# Patient Record
Sex: Male | Born: 1975 | Race: White | Hispanic: No | Smoking: Current some day smoker
Health system: Southern US, Community
[De-identification: ages and names within clinical notes are randomized; demographics above are authoritative.]

---

## 2018-06-04 ENCOUNTER — Emergency Department (HOSPITAL_COMMUNITY)
Admission: EM | Admit: 2018-06-04 | Discharge: 2018-06-04 | Disposition: A | Discharge status: Home / Self Care | Payer: BLUE CROSS/BLUE SHIELD | Attending: Emergency Medicine | Admitting: Emergency Medicine

## 2018-06-04 ENCOUNTER — Encounter (HOSPITAL_COMMUNITY): Payer: Self-pay

## 2018-06-04 ENCOUNTER — Emergency Department (HOSPITAL_COMMUNITY): Payer: BLUE CROSS/BLUE SHIELD

## 2018-06-04 ENCOUNTER — Other Ambulatory Visit: Payer: Self-pay

## 2018-06-04 DIAGNOSIS — Z03818 Encounter for observation for suspected exposure to other biological agents ruled out: Secondary | ICD-10-CM | POA: Diagnosis not present

## 2018-06-04 DIAGNOSIS — R002 Palpitations: Secondary | ICD-10-CM | POA: Insufficient documentation

## 2018-06-04 DIAGNOSIS — R42 Dizziness and giddiness: Secondary | ICD-10-CM | POA: Insufficient documentation

## 2018-06-04 DIAGNOSIS — H52532 Spasm of accommodation, left eye: Secondary | ICD-10-CM | POA: Diagnosis not present

## 2018-06-04 DIAGNOSIS — R079 Chest pain, unspecified: Secondary | ICD-10-CM | POA: Insufficient documentation

## 2018-06-04 DIAGNOSIS — R202 Paresthesia of skin: Secondary | ICD-10-CM | POA: Diagnosis not present

## 2018-06-04 DIAGNOSIS — R1010 Upper abdominal pain, unspecified: Secondary | ICD-10-CM | POA: Insufficient documentation

## 2018-06-04 DIAGNOSIS — F17210 Nicotine dependence, cigarettes, uncomplicated: Secondary | ICD-10-CM | POA: Insufficient documentation

## 2018-06-04 DIAGNOSIS — R0602 Shortness of breath: Secondary | ICD-10-CM | POA: Insufficient documentation

## 2018-06-04 DIAGNOSIS — Z20828 Contact with and (suspected) exposure to other viral communicable diseases: Secondary | ICD-10-CM | POA: Diagnosis not present

## 2018-06-04 LAB — CBC WITH DIFFERENTIAL/PLATELET
Abs Immature Granulocytes: 0.04 10*3/uL (ref 0.00–0.07)
Basophils Absolute: 0 10*3/uL (ref 0.0–0.1)
Basophils Relative: 1 %
Eosinophils Absolute: 0.1 10*3/uL (ref 0.0–0.5)
Eosinophils Relative: 2 %
HCT: 44.9 % (ref 39.0–52.0)
Hemoglobin: 14.6 g/dL (ref 13.0–17.0)
Immature Granulocytes: 1 %
Lymphocytes Relative: 23 %
Lymphs Abs: 1.9 10*3/uL (ref 0.7–4.0)
MCH: 29.4 pg (ref 26.0–34.0)
MCHC: 32.5 g/dL (ref 30.0–36.0)
MCV: 90.3 fL (ref 80.0–100.0)
Monocytes Absolute: 0.9 10*3/uL (ref 0.1–1.0)
Monocytes Relative: 11 %
Neutro Abs: 5.4 10*3/uL (ref 1.7–7.7)
Neutrophils Relative %: 62 %
Platelets: 248 10*3/uL (ref 150–400)
RBC: 4.97 MIL/uL (ref 4.22–5.81)
RDW: 12.8 % (ref 11.5–15.5)
WBC: 8.4 10*3/uL (ref 4.0–10.5)
nRBC: 0 % (ref 0.0–0.2)

## 2018-06-04 LAB — COMPREHENSIVE METABOLIC PANEL
ALT: 36 U/L (ref 0–44)
AST: 31 U/L (ref 15–41)
Albumin: 4.2 g/dL (ref 3.5–5.0)
Alkaline Phosphatase: 71 U/L (ref 38–126)
Anion gap: 8 (ref 5–15)
BUN: 15 mg/dL (ref 6–20)
CO2: 26 mmol/L (ref 22–32)
Calcium: 9.3 mg/dL (ref 8.9–10.3)
Chloride: 104 mmol/L (ref 98–111)
Creatinine, Ser: 0.85 mg/dL (ref 0.61–1.24)
GFR calc Af Amer: >60 mL/min (ref >60)
GFR calc non Af Amer: >60 mL/min (ref >60)
Glucose, Bld: 132 mg/dL — ABNORMAL HIGH (ref 70–99)
Potassium: 3.9 mmol/L (ref 3.5–5.1)
Sodium: 138 mmol/L (ref 135–145)
Total Bilirubin: 0.7 mg/dL (ref 0.3–1.2)
Total Protein: 7.7 g/dL (ref 6.5–8.1)

## 2018-06-04 LAB — URINALYSIS, ROUTINE W REFLEX MICROSCOPIC
Bilirubin Urine: NEGATIVE
Glucose, UA: NEGATIVE mg/dL
Hgb urine dipstick: NEGATIVE
Ketones, ur: NEGATIVE mg/dL
Leukocytes,Ua: NEGATIVE
Nitrite: NEGATIVE
Protein, ur: NEGATIVE mg/dL
Specific Gravity, Urine: 1.014 (ref 1.005–1.030)
pH: 7 (ref 5.0–8.0)

## 2018-06-04 LAB — LIPASE, BLOOD: Lipase: 25 U/L (ref 11–51)

## 2018-06-04 LAB — SARS CORONAVIRUS 2 BY RT PCR (HOSPITAL ORDER, PERFORMED IN ~~LOC~~ HOSPITAL LAB): SARS Coronavirus 2: NEGATIVE

## 2018-06-04 LAB — TROPONIN I
Troponin I: <0.03 ng/mL (ref <0.03)
Troponin I: <0.03 ng/mL (ref <0.03)

## 2018-06-04 LAB — D-DIMER, QUANTITATIVE: D-Dimer, Quant: <0.27 ug/mL-FEU (ref 0.00–0.50)

## 2018-06-04 MED ORDER — SODIUM CHLORIDE 0.9 % IV BOLUS
1000.0000 mL | Freq: Once | INTRAVENOUS | Status: AC
  Administered 2018-06-04: 13:00:00 1000 mL via INTRAVENOUS

## 2018-06-04 MED ORDER — ALUM & MAG HYDROXIDE-SIMETH 200-200-20 MG/5ML PO SUSP
30.0000 mL | Freq: Once | ORAL | Status: AC
  Administered 2018-06-04: 30 mL via ORAL
  Filled 2018-06-04: qty 30

## 2018-06-04 MED ORDER — PANTOPRAZOLE SODIUM 40 MG IV SOLR
40.0000 mg | Freq: Once | INTRAVENOUS | Status: AC
  Administered 2018-06-04: 40 mg via INTRAVENOUS
  Filled 2018-06-04: qty 40

## 2018-06-04 MED ORDER — FAMOTIDINE IN NACL 20-0.9 MG/50ML-% IV SOLN
20.0000 mg | Freq: Once | INTRAVENOUS | Status: AC
  Administered 2018-06-04: 14:00:00 20 mg via INTRAVENOUS
  Filled 2018-06-04: qty 50

## 2018-06-04 MED ORDER — LIDOCAINE VISCOUS HCL 2 % MT SOLN
15.0000 mL | Freq: Once | OROMUCOSAL | Status: AC
  Administered 2018-06-04: 13:00:00 15 mL via ORAL
  Filled 2018-06-04: qty 15

## 2018-06-04 NOTE — ED Triage Notes (Signed)
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

## 2018-06-04 NOTE — ED Notes (Signed)
Patient transported to X-ray 

## 2018-06-04 NOTE — ED Notes (Signed)
Pt ambulated to restroom. 

## 2018-06-04 NOTE — ED Provider Notes (Signed)
[facility] Note   CSN: [id number] Arrival date & time:       History   Chief Complaint Chief Complaint  Patient presents with   Chest Pain    HPI Alan Evans is a 43 y.o. male.     HPI  Alan Evans is a 43 y.o. male, patient with no pertinent past medical history, presenting to the ED with palpitations. 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  While driving, he states the muscles around his left eye began to twitch, this made him anxious as he did not know what was happening.  He began to feel "tightness in the diaphragm area" combined with heart racing, shortness of breath, lightheadedness, and tingling in hands and feet.  He pulled over, went inside to urinate, then called 911.  He took 325 mg aspirin prior to arrival. Symptoms lasted for 1 to 2 minutes.  He still endorses discomfort in the upper abdomen/lower chest that "feels like gas."  This sensation improves with burping. He states he has had instances of anxiety in the past, but none to this degree and with sense of panic. As of this weekend, he was able to coach and play baseball, including running drills without onset of any symptoms. He adds he had a stressful and upsetting conversation with his son earlier this morning.  He also added an extra shot of espresso to his coffee midmorning. Denies fever/chills, syncope, diaphoresis, N/V/D, urinary symptoms, recent illness, cough, current shortness of breath, vision changes, neurologic deficits, lower extremity edema/pain, or any other complaints.    History reviewed. No pertinent past medical history.  There are no active problems to display for this patient.   History reviewed. No pertinent surgical history.      Home Medications    Prior to Admission medications   Not on File    Family History No family history on file.  Social History Social History   Tobacco Use   Smoking  status: Current Some Day Smoker    Types: Cigarettes   Smokeless tobacco: Current User    Types: Snuff  Substance Use Topics   Alcohol use: Yes    Alcohol/week: 4.0 standard drinks    Types: 4 Cans of beer per week   Drug use: Not Currently     Allergies   Patient has no known allergies.   Review of Systems Review of Systems  Constitutional: Negative for chills, diaphoresis and fever.  Eyes: Negative for visual disturbance.  Respiratory: Positive for shortness of breath (resolved). Negative for cough.   Cardiovascular: Positive for palpitations.  Gastrointestinal: Positive for abdominal pain (Upper abdominal pressure). Negative for diarrhea, nausea and vomiting.  Musculoskeletal: Negative for back pain.  Neurological: Positive for light-headedness. Negative for syncope, weakness, numbness and headaches.  Psychiatric/Behavioral: Negative for confusion.  All other systems reviewed and are negative.    Physical Exam Updated Vital Signs BP (!) 154/80 (BP Location: Right Arm)    Pulse 81    Temp 98.8 F (37.1 C) (Oral)    Resp 20    Ht 5' 10.5" (1.791 m)    Wt 131.5 kg    SpO2 100%    BMI 41.02 kg/m   Physical Exam Vitals signs and nursing note reviewed.  Constitutional:      General: He is not in acute distress.    Appearance: He is well-developed. He is obese. He is not diaphoretic.  HENT:     Head:  Normocephalic and atraumatic.     Mouth/Throat:     Mouth: Mucous membranes are moist.     Pharynx: Oropharynx is clear.  Eyes:     Conjunctiva/sclera: Conjunctivae normal.  Neck:     Musculoskeletal: Neck supple.  Cardiovascular:     Rate and Rhythm: Normal rate and regular rhythm.     Pulses: Normal pulses.          Radial pulses are 2+ on the right side and 2+ on the left side.       Posterior tibial pulses are 2+ on the right side and 2+ on the left side.     Heart sounds: Normal heart sounds.     Comments: Tactile temperature in the extremities appropriate and  equal bilaterally. Pulmonary:     Effort: Pulmonary effort is normal. No respiratory distress.     Breath sounds: Normal breath sounds.  Abdominal:     Palpations: Abdomen is soft.     Tenderness: There is no abdominal tenderness. There is no guarding.  Musculoskeletal:     Right lower leg: No edema.     Left lower leg: No edema.  Lymphadenopathy:     Cervical: No cervical adenopathy.  Skin:    General: Skin is warm and dry.  Neurological:     Mental Status: He is alert.     Comments: Sensation grossly intact to light touch in the extremities.  Grip strengths equal bilaterally.  Strength 5/5 in all extremities. No gait disturbance. Coordination intact. Cranial nerves III-XII grossly intact. No facial droop.   Psychiatric:        Mood and Affect: Mood and affect normal.        Speech: Speech normal.        Behavior: Behavior normal.      ED Treatments / Results  Labs (all labs ordered are listed, but only abnormal results are displayed) Labs Reviewed  COMPREHENSIVE METABOLIC PANEL - Abnormal; Notable for the following components:      Result Value   Glucose, Bld 132 (*)    All other components within normal limits  SARS CORONAVIRUS 2 (HOSPITAL ORDER, PERFORMED IN [facility])  CBC WITH DIFFERENTIAL/PLATELET  URINALYSIS, ROUTINE W REFLEX MICROSCOPIC  LIPASE, BLOOD  TROPONIN I  D-DIMER, QUANTITATIVE (NOT AT [facility])  TROPONIN I    EKG EKG Interpretation  Date/Time:  Tuesday Jun 04 2018 12:02:30 EDT Ventricular Rate:  86 PR Interval:    QRS Duration: 98 QT Interval:  356 QTC Calculation: 426 R Axis:   23 Text Interpretation:  Sinus rhythm No old tracing to compare Confirmed by Rolan Bucco [phone]) on 06/04/2018 12:06:13 PM   Radiology Dg Chest 2 View  Result Date: 06/04/2018 CLINICAL DATA:  Shortness of Breath and chest pain EXAM: CHEST - 2 VIEW COMPARISON:  None. FINDINGS: Cardiac shadow is at the upper limits of normal in size. The lungs are clear.  No infiltrate is noted. No acute bony abnormality is noted. IMPRESSION: No acute abnormality noted. Electronically Signed   By: Alcide Clever M.D.   On: 06/04/2018 13:07    Procedures Procedures (including critical care time)  Medications Ordered in ED Medications  pantoprazole (PROTONIX) injection 40 mg (40 mg Intravenous Given 06/04/18 1328)  famotidine (PEPCID) IVPB 20 mg premix (0 mg Intravenous Stopped 06/04/18 1412)  sodium chloride 0.9 % bolus 1,000 mL (0 mLs Intravenous Stopped 06/04/18 1501)  alum & mag hydroxide-simeth (MAALOX/MYLANTA) 200-200-20 MG/5ML suspension 30 mL (30 mLs Oral Given  06/04/18 1325)    And  lidocaine (XYLOCAINE) 2 % viscous mouth solution 15 mL (15 mLs Oral Given 06/04/18 1325)     Initial Impression / Assessment and Plan / ED Course  I have reviewed the triage vital signs and the nursing notes.  Pertinent labs & imaging results that were available during my care of the patient were reviewed by me and considered in my medical decision making (see chart for details).  Clinical Course as of Jun 04 1755  Tue Jun 04, 2018  1429 Patient voices improvement in symptoms.    [SJ]    Clinical Course User Index [SJ] Marlisa Caridi C, PA-C       Patient presents with an episode of chest discomfort, shortness of breath, tachypnea, and palpitations.  Patient is nontoxic appearing, afebrile, not tachycardic, not tachypneic, not hypotensive, maintains excellent SPO2 on room air, and is in no apparent distress.   Low suspicion for ACS. HEART score is 1, indicating low risk for a cardiac event.  EKG without evidence of acute ischemia or pathologic/symptomatic arrhythmia.  Delta troponins negative. Wells criteria score is 0, indicating low risk for PE.  D-dimer negative. Dissection was considered, but thought less likely base on: History and description of the pain are not suggestive, patient is not ill-appearing, lack of risk factors, equal bilateral pulses, lack of neurologic  deficits, no widened mediastinum on chest x-ray. No acute abnormalities on chest x-ray. Patient tested for coronavirus due to history of frequent travel as well as symptoms of chest discomfort.  Coronavirus test returned negative.  Based on patient's description of the event, reassuring lab work and EKG, and details above, my suspicion for cardiac event is low and features suggest possible panic attack.  However, patient will follow-up with cardiology for further testing as necessary. The patient was given instructions for home care as well as return precautions. Patient voices understanding of these instructions, accepts the plan, and is comfortable with discharge.  Dionis Spar was evaluated in Emergency Department on 06/04/2018 for the symptoms described in the history of present illness. He was evaluated in the context of the global COVID-19 pandemic, which necessitated consideration that the patient might be at risk for infection with the SARS-CoV-2 virus that causes COVID-19. Institutional protocols and algorithms that pertain to the evaluation of patients at risk for COVID-19 are in a state of rapid change based on information released by regulatory bodies including the CDC and federal and state organizations. These policies and algorithms were followed during the patient's care in the ED.   Vitals:   06/04/18 1530 06/04/18 1630 06/04/18 1700 06/04/18 1751  BP: 136/82 (!) 147/75 (!) 151/83 121/72  Pulse: 65 (!) 58 (!) 57 60  Resp: 18 15 18 18   Temp:      TempSrc:      SpO2: 98% 95% 94% 97%  Weight:      Height:        Final Clinical Impressions(s) / ED Diagnoses   Final diagnoses:  Lightheadedness    ED Discharge Orders    None       Concepcion Living 06/04/18 1759    Rolan Bucco, MD 06/04/18 1806

## 2018-06-04 NOTE — ED Notes (Signed)
Pt's boss Reuel Boom HY#[phone]. Pls inform Pt

## 2018-06-04 NOTE — Discharge Instructions (Addendum)
Lab work and chest x-ray reassuring today. Be sure to stay well-hydrated by drinking plenty of water. Follow-up with cardiology on this matter.  Call the number provided to set up an appointment. Return to the ED for chest pain, shortness of breath, dizziness, passing out, or any other major concerns.

## 2018-06-07 ENCOUNTER — Emergency Department (HOSPITAL_COMMUNITY): Payer: BLUE CROSS/BLUE SHIELD

## 2018-06-07 ENCOUNTER — Encounter (HOSPITAL_COMMUNITY): Payer: Self-pay

## 2018-06-07 ENCOUNTER — Other Ambulatory Visit: Payer: Self-pay

## 2018-06-07 ENCOUNTER — Emergency Department (HOSPITAL_COMMUNITY)
Admission: EM | Admit: 2018-06-07 | Discharge: 2018-06-07 | Disposition: A | Discharge status: Home / Self Care | Payer: BLUE CROSS/BLUE SHIELD | Attending: Emergency Medicine | Admitting: Emergency Medicine

## 2018-06-07 DIAGNOSIS — Z7982 Long term (current) use of aspirin: Secondary | ICD-10-CM | POA: Insufficient documentation

## 2018-06-07 DIAGNOSIS — R0789 Other chest pain: Secondary | ICD-10-CM | POA: Diagnosis not present

## 2018-06-07 DIAGNOSIS — R079 Chest pain, unspecified: Secondary | ICD-10-CM

## 2018-06-07 DIAGNOSIS — R42 Dizziness and giddiness: Secondary | ICD-10-CM | POA: Insufficient documentation

## 2018-06-07 DIAGNOSIS — F419 Anxiety disorder, unspecified: Secondary | ICD-10-CM | POA: Diagnosis not present

## 2018-06-07 LAB — CBC WITH DIFFERENTIAL/PLATELET
Abs Immature Granulocytes: 0.05 10*3/uL (ref 0.00–0.07)
Basophils Absolute: 0 10*3/uL (ref 0.0–0.1)
Basophils Relative: 0 %
Eosinophils Absolute: 0.1 10*3/uL (ref 0.0–0.5)
Eosinophils Relative: 1 %
HCT: 46.7 % (ref 39.0–52.0)
Hemoglobin: 15 g/dL (ref 13.0–17.0)
Immature Granulocytes: 1 %
Lymphocytes Relative: 16 %
Lymphs Abs: 1.5 10*3/uL (ref 0.7–4.0)
MCH: 29 pg (ref 26.0–34.0)
MCHC: 32.1 g/dL (ref 30.0–36.0)
MCV: 90.3 fL (ref 80.0–100.0)
Monocytes Absolute: 0.8 10*3/uL (ref 0.1–1.0)
Monocytes Relative: 8 %
Neutro Abs: 6.8 10*3/uL (ref 1.7–7.7)
Neutrophils Relative %: 74 %
Platelets: 232 10*3/uL (ref 150–400)
RBC: 5.17 MIL/uL (ref 4.22–5.81)
RDW: 12.7 % (ref 11.5–15.5)
WBC: 9.2 10*3/uL (ref 4.0–10.5)
nRBC: 0 % (ref 0.0–0.2)

## 2018-06-07 LAB — URINALYSIS, ROUTINE W REFLEX MICROSCOPIC
Bilirubin Urine: NEGATIVE
Glucose, UA: NEGATIVE mg/dL
Hgb urine dipstick: NEGATIVE
Ketones, ur: NEGATIVE mg/dL
Leukocytes,Ua: NEGATIVE
Nitrite: NEGATIVE
Protein, ur: NEGATIVE mg/dL
Specific Gravity, Urine: 1.009 (ref 1.005–1.030)
pH: 7 (ref 5.0–8.0)

## 2018-06-07 LAB — COMPREHENSIVE METABOLIC PANEL
ALT: 38 U/L (ref 0–44)
AST: 26 U/L (ref 15–41)
Albumin: 4.3 g/dL (ref 3.5–5.0)
Alkaline Phosphatase: 68 U/L (ref 38–126)
Anion gap: 13 (ref 5–15)
BUN: 10 mg/dL (ref 6–20)
CO2: 20 mmol/L — ABNORMAL LOW (ref 22–32)
Calcium: 9.6 mg/dL (ref 8.9–10.3)
Chloride: 106 mmol/L (ref 98–111)
Creatinine, Ser: 0.74 mg/dL (ref 0.61–1.24)
GFR calc Af Amer: >60 mL/min (ref >60)
GFR calc non Af Amer: >60 mL/min (ref >60)
Glucose, Bld: 103 mg/dL — ABNORMAL HIGH (ref 70–99)
Potassium: 4.1 mmol/L (ref 3.5–5.1)
Sodium: 139 mmol/L (ref 135–145)
Total Bilirubin: 0.8 mg/dL (ref 0.3–1.2)
Total Protein: 7.2 g/dL (ref 6.5–8.1)

## 2018-06-07 LAB — TROPONIN I: Troponin I: <0.03 ng/mL (ref <0.03)

## 2018-06-07 LAB — BRAIN NATRIURETIC PEPTIDE: B Natriuretic Peptide: 17.6 pg/mL (ref 0.0–100.0)

## 2018-06-07 MED ORDER — LORAZEPAM 1 MG PO TABS
1.0000 mg | ORAL_TABLET | Freq: Once | ORAL | Status: AC
  Administered 2018-06-07: 13:00:00 1 mg via ORAL
  Filled 2018-06-07: qty 1

## 2018-06-07 MED ORDER — SODIUM CHLORIDE 0.9 % IV BOLUS: 1000.0000 mL | Freq: Once | INTRAVENOUS | Status: DC

## 2018-06-07 NOTE — ED Triage Notes (Signed)
Pt brought in by [facility] from home for panic attack, hx of same on Tuesday. Pt states he was evaluated for same symptoms, dx with panic attack. Pt endorses extremity tingling and SOB. On EMS arrival pt only c/o hand and feet tingling. Pt A+Ox4, skin warm and dry. Pt now denies SOB.

## 2018-06-07 NOTE — Discharge Instructions (Signed)
Your laboratory results were within normal limits today.  Your chest x-ray was negative.  Please follow-up with your primary care physician on Monday at your schedule appointment.  If you experience any shortness of breath, chest pain or worsening symptoms you may return to the emergency department.

## 2018-06-07 NOTE — ED Provider Notes (Signed)
[facility] EMERGENCY DEPARTMENT Provider Note   CSN: [id number] Arrival date & time: 06/07/18  1119    History   Chief Complaint Chief Complaint  Patient presents with  . Anxiety    HPI Alan Evans is a 43 y.o. male.     43 y.o male with a PMH of Anxiety presents to the ED via EMS with a chief complaint of pre syncope.  Patient reports he was standing in the line at the gas station when he began to feel flushed, states he felt tingling all along his hands and feet he reports "I felt like I was going to pass out ", he immediately bought a bottle of aspirin taking 1 325 mg aspirin.  Reports walking back to his car sitting down taking deep breaths but feeling on ease, like he was going to syncopized when he called 911.  He reports a similar episode a couple of days ago, was seen in the ED with a negative cardiac work-up.  Patient reports today a chest pressure located in the center of his chest radiating to the epigastric region of his abdomen, no exacerbating or alleviating factors although he does report it helps with deep breathing.  He reports no cardiac risk factors, does not have a cardiac history, states he follows up with his primary care physician as scheduled. No previous history of blood clots or family history of sudden death.  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 He also reports a history of smokeless tobacco.       Home Medications    Prior to Admission medications   Medication Sig Start Date End Date Taking? Authorizing Provider  aspirin 325 MG tablet Take 325 mg by mouth daily.   Yes [provider]  Aspirin-Salicylamide-Caffeine (BC HEADACHE POWDER PO) Take 1 packet by mouth as directed.   Yes [provider]  naproxen (NAPROSYN) 500 MG tablet Take 500 mg by mouth 2 (two) times a day.   Yes [provider]    Family History No family history on file.  Social History Social History    Tobacco Use  . Smoking status: Current Some Day Smoker    Types: Cigarettes  . Smokeless tobacco: Current User    Types: Snuff  Substance Use Topics  . Alcohol use: Yes    Alcohol/week: 4.0 standard drinks    Types: 4 Cans of beer per week  . Drug use: Not Currently     Allergies   Bee venom   Review of Systems Review of Systems  Constitutional: Negative for chills and fever.  HENT: Negative for ear pain and sore throat.   Eyes: Negative for pain and visual disturbance.  Respiratory: Negative for cough and shortness of breath.   Cardiovascular: Positive for chest pain. Negative for palpitations.  Gastrointestinal: Negative for abdominal pain and vomiting.  Genitourinary: Negative for dysuria and hematuria.  Musculoskeletal: Negative for arthralgias and back pain.  Skin: Negative for color change and rash.  Neurological: Positive for light-headedness. Negative for seizures, syncope and weakness.  All other systems reviewed and are negative.    Physical Exam Updated Vital Signs BP (!) 151/80 (BP Location: Left Arm)   Pulse 63   Temp 99.8 F (37.7 C) (Oral)   Resp 14   Ht 5\' 10"  (1.778 m)   Wt 131.5 kg   SpO2 98%   BMI 41.61 kg/m   Physical Exam Vitals signs and nursing note reviewed.  Constitutional:  Appearance: He is well-developed.  HENT:     Head: Normocephalic and atraumatic.  Eyes:     General: No scleral icterus.    Pupils: Pupils are equal, round, and reactive to light.  Neck:     Musculoskeletal: Normal range of motion.  Cardiovascular:     Heart sounds: Normal heart sounds.  Pulmonary:     Effort: Pulmonary effort is normal.     Breath sounds: Normal breath sounds. No wheezing.  Chest:     Chest wall: No tenderness.  Abdominal:     General: Bowel sounds are normal. There is no distension.     Palpations: Abdomen is soft.     Tenderness: There is no abdominal tenderness.  Musculoskeletal:        General: No tenderness or deformity.   Skin:    General: Skin is warm and dry.  Neurological:     Mental Status: He is alert and oriented to person, place, and time.     Comments: Alert, oriented, thought content appropriate. Speech fluent without evidence of aphasia. Able to follow 2 step commands without difficulty.  Cranial Nerves:  II:  Peripheral visual fields grossly normal, pupils, round, reactive to light III,IV, VI: ptosis not present, extra-ocular motions intact bilaterally  V,VII: smile symmetric, facial light touch sensation equal VIII: hearing grossly normal bilaterally  IX,X: midline uvula rise  XI: bilateral shoulder shrug equal and strong XII: midline tongue extension  Motor:  5/5 in upper and lower extremities bilaterally including strong and equal grip strength and dorsiflexion/plantar flexion Sensory: light touch normal in all extremities.  Cerebellar: normal finger-to-nose with bilateral upper extremities, pronator drift negative Gait: normal gait and balance       ED Treatments / Results  Labs (all labs ordered are listed, but only abnormal results are displayed) Labs Reviewed  URINALYSIS, ROUTINE W REFLEX MICROSCOPIC - Abnormal; Notable for the following components:      Result Value   Color, Urine STRAW (*)    All other components within normal limits  COMPREHENSIVE METABOLIC PANEL - Abnormal; Notable for the following components:   CO2 20 (*)    Glucose, Bld 103 (*)    All other components within normal limits  CBC WITH DIFFERENTIAL/PLATELET  TROPONIN I  BRAIN NATRIURETIC PEPTIDE    EKG EKG Interpretation  Date/Time:  Friday Jun 07 2018 12:15:42 EDT Ventricular Rate:  76 PR Interval:  126 QRS Duration: 96 QT Interval:  388 QTC Calculation: 436 R Axis:   37 Text Interpretation:  Normal sinus rhythm with sinus arrhythmia Normal ECG No significant change since last tracing Confirmed by Shaune PollackIsaacs, Cameron [phone]([id number]) on 06/07/2018 12:18:41 PM   Radiology Dg Chest 2 View  Result Date:  06/07/2018 CLINICAL DATA:  Shortness of breath. EXAM: CHEST - 2 VIEW COMPARISON:  Radiographs of Jun 04, 2018 FINDINGS: The heart size and mediastinal contours are within normal limits. Both lungs are clear. No pneumothorax or pleural effusion is noted. The visualized skeletal structures are unremarkable. IMPRESSION: No active cardiopulmonary disease. Electronically Signed   By: Lupita RaiderJames  Green Jr M.D.   On: 06/07/2018 12:57    Procedures Procedures (including critical care time)  Medications Ordered in ED Medications  LORazepam (ATIVAN) tablet 1 mg (1 mg Oral Given 06/07/18 1236)     Initial Impression / Assessment and Plan / ED Course  I have reviewed the triage vital signs and the nursing notes.  Pertinent labs & imaging results that were available during my care of the  patient were reviewed by me and considered in my medical decision making (see chart for details).      Patient with no past medical history presents to the ED with a chief complaint of presyncopal episode.  Patient reports he was trying to buy food at a gas station when he began feeling flushed, feel like he was going to syncopized.  A similar episode happened 3 days ago, he was seen in the ED with a reassuring work-up.  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  Today he also endorses some pain along his chest.  CBC today looks unremarkable, no leukocytosis, hemoglobin stable.  CMP shows slight decrease in CO2, patient likely dehydrated although he reports he has been increasing his water intake.  LFTs are unremarkable, creatinine level is normal.  Troponin was negative.  EKG showed no changes consistent with infarct or STEMI.  Chest x-ray showed no acute process such as consolidation, pneumothorax, pleural effusion.  Urinalysis was negative for any nitrites or leukocytes.  Patient's  blood pressure has been stable during his ED visit, no tachycardia, no hypoxia, had a negative d-dimer during his last visit.  There is no swelling to his legs, he does report increasing urination as he has been drinking more water lately we will also check a BNP.  Low suspicion for ACS as patient has no cardiac risk factors, no previous family history and had negative delta troponins during his last visit.  Patient reports improvement in symptoms after 1 mg of Ativan. Shared decision making conversation with patient about obtaining further valuation in an outpatient basis with perhaps a Holter monitor.  Patient reports he knows a good cardiologist Dr. [place] Powers in [place].  He also has an appointment with his primary care physician on Monday for further evaluation of these episodes.  BNP level was unremarkable. Patient with stable vital signs, improved will discharge with follow up with PCP on Monday at his scheduled appointment.    Portions of this note were generated with Scientist, clinical (histocompatibility and immunogenetics). Dictation errors may occur despite best attempts at proofreading.    Final Clinical Impressions(s) / ED Diagnoses   Final diagnoses:  Chest pain, unspecified type    ED Discharge Orders    None       Claude Manges, PA-C 06/07/18 1540    Shaune Pollack, MD 06/09/18 1523

## 2020-05-06 IMAGING — CR CHEST - 2 VIEW
2 series · 2 of 2 positions shown · non-contrast
Comparison: None.

CLINICAL DATA: Shortness of Breath and chest pain

EXAM:
CHEST - 2 VIEW

[chest pa]
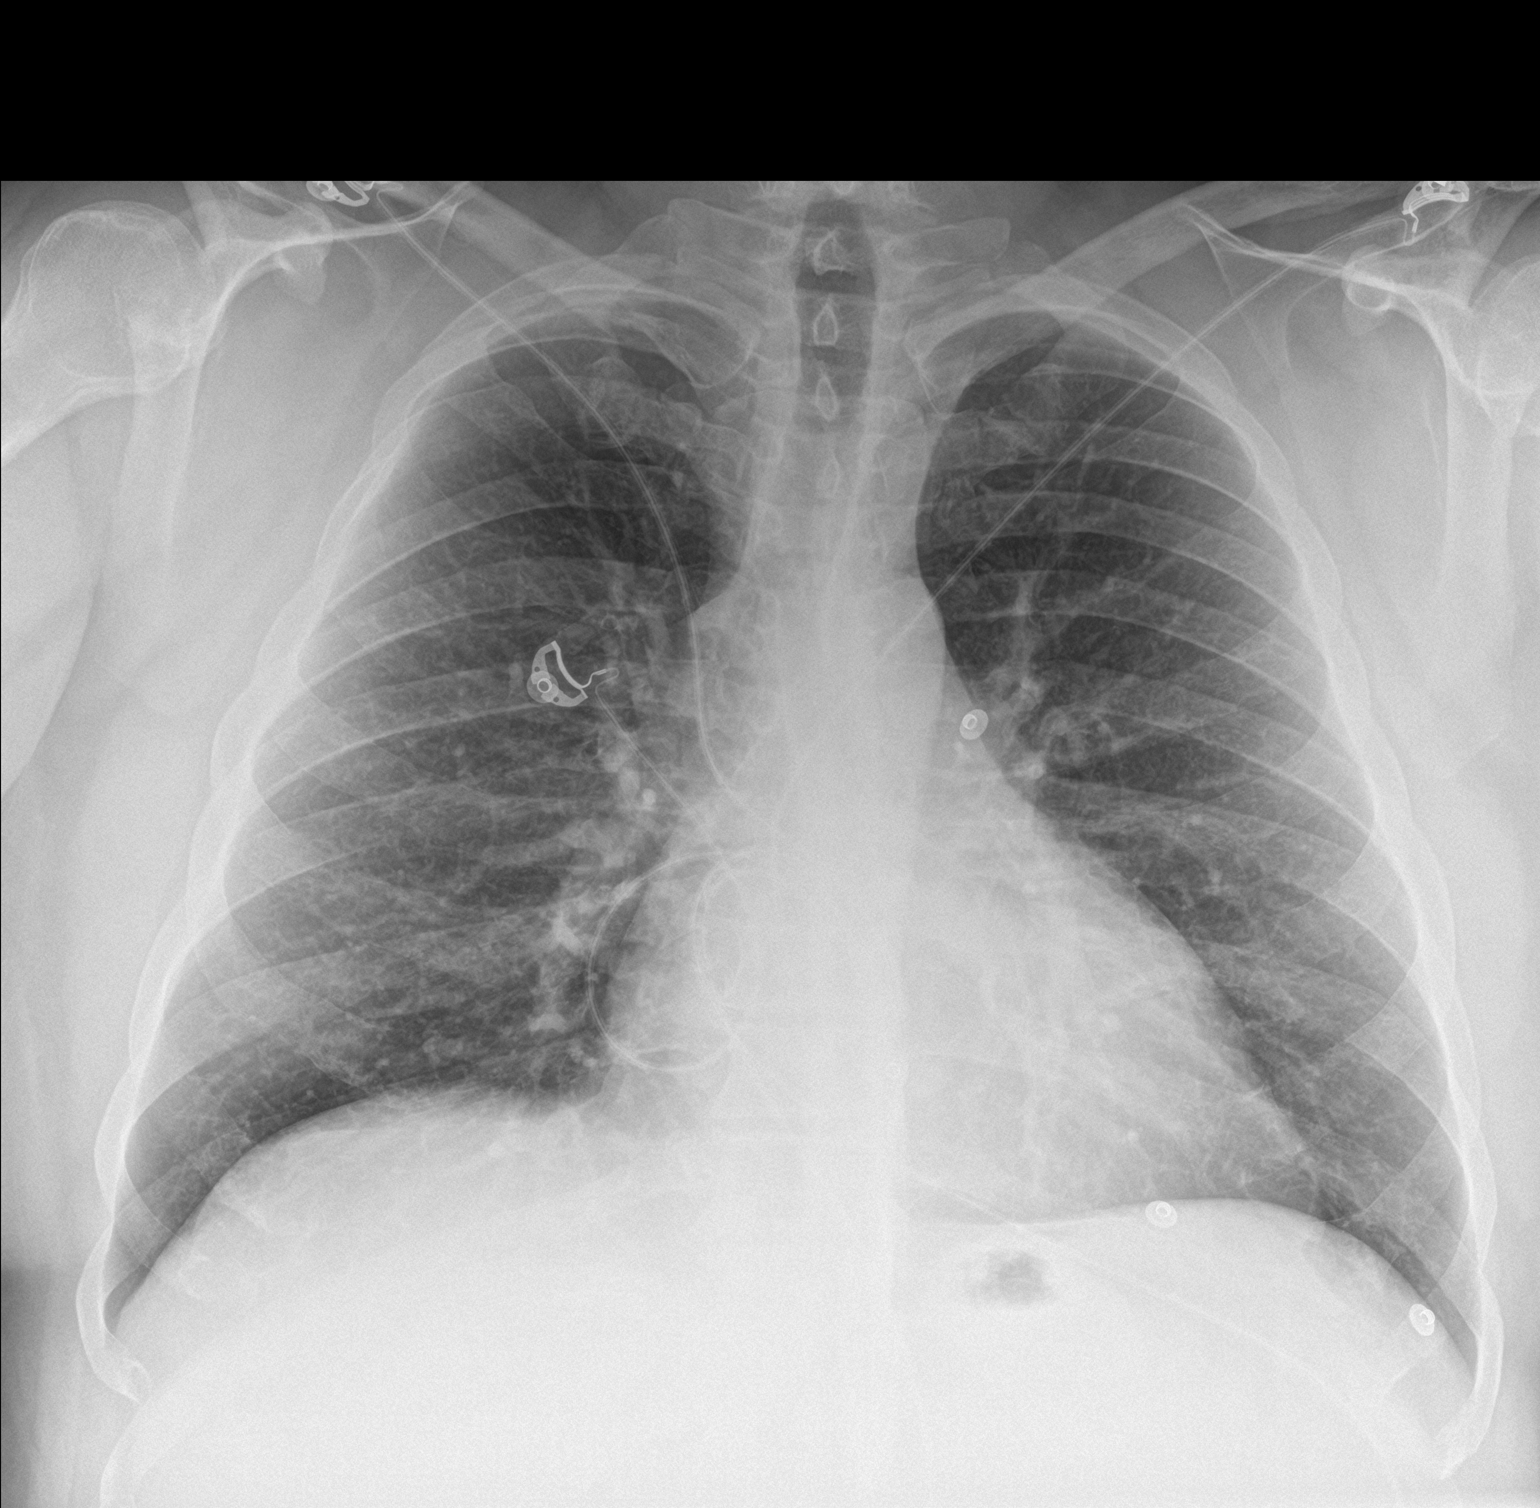

[chest lat]
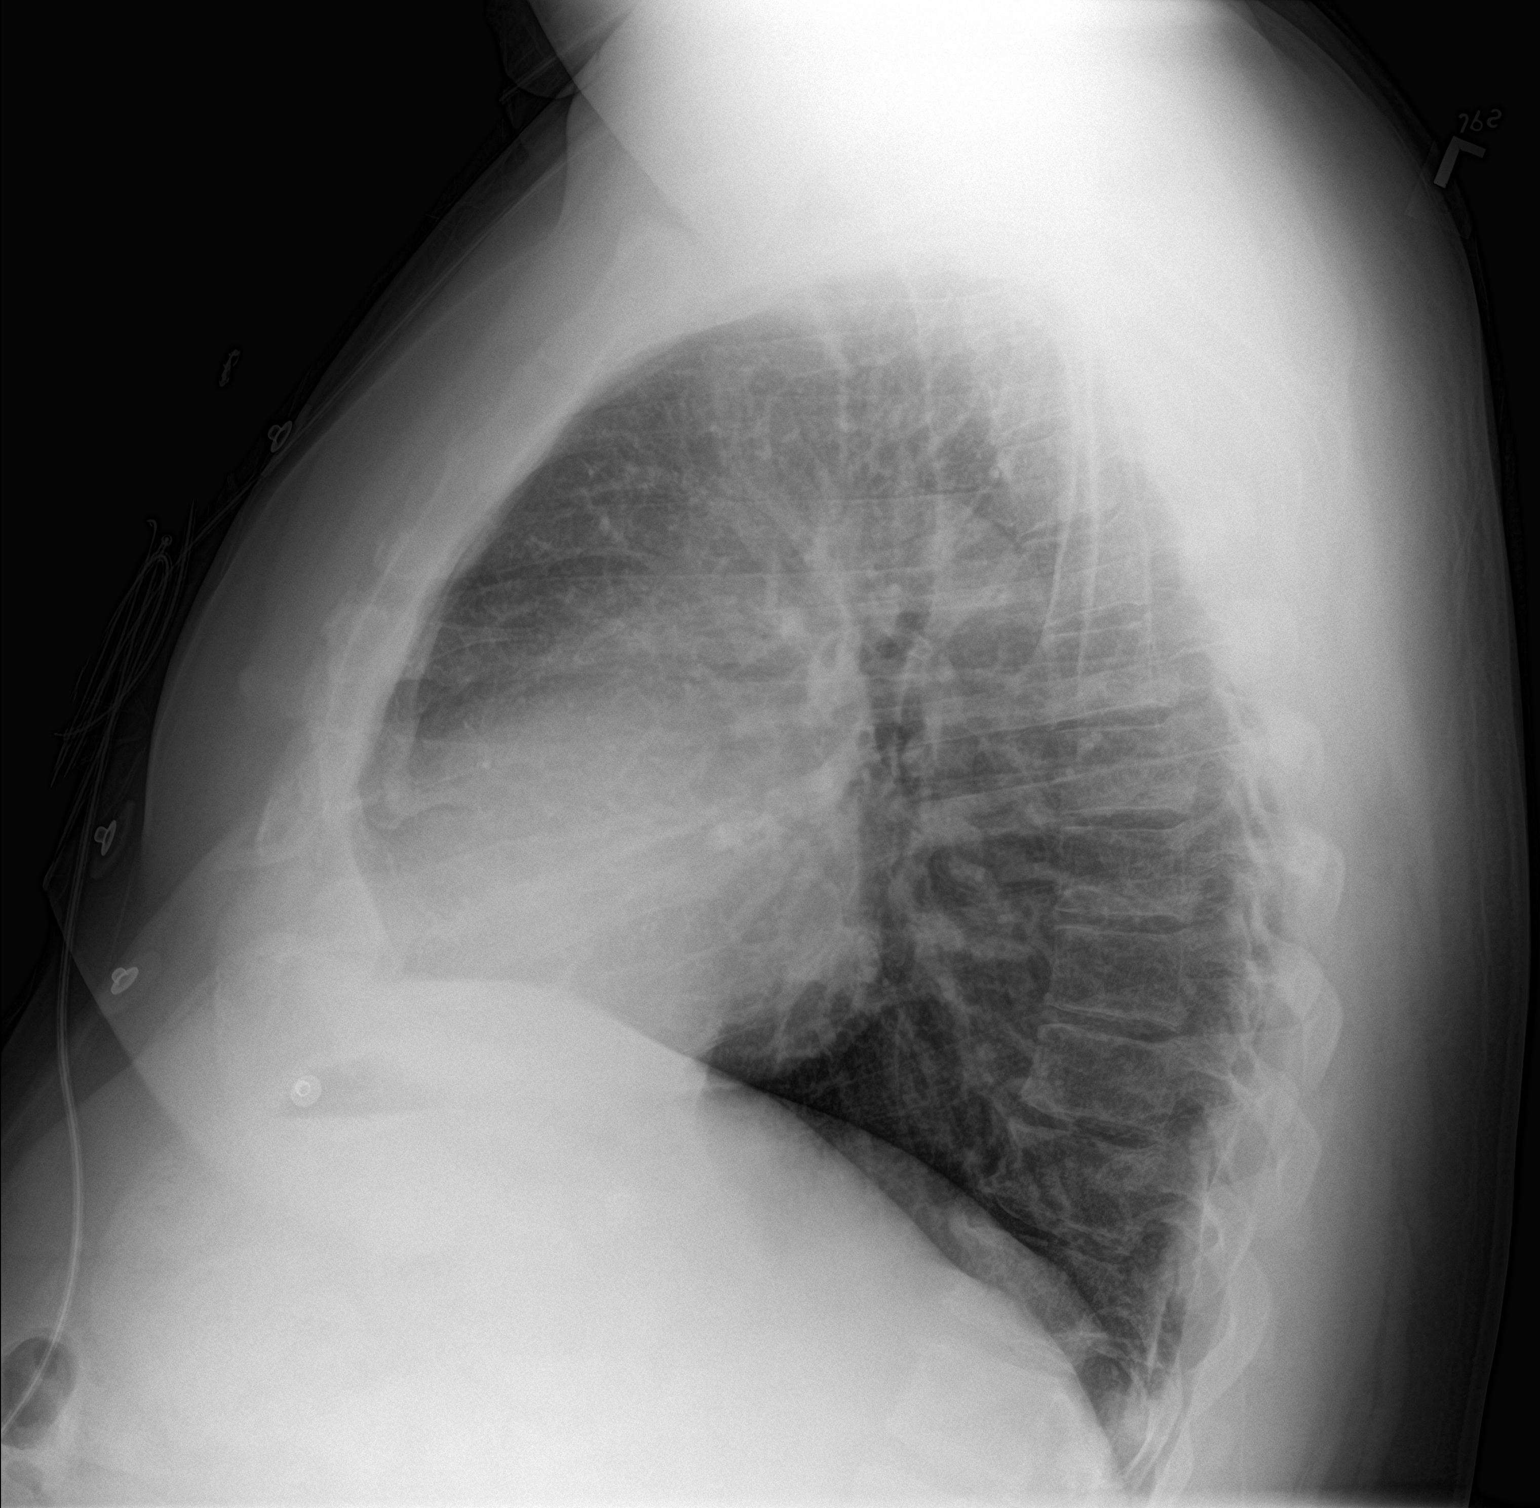

[2 of 2 positions shown; findings below may reference images not displayed]

FINDINGS: Cardiac shadow is at the upper limits of normal in size. The lungs
are clear. No infiltrate is noted. No acute bony abnormality is
noted.
IMPRESSION: No acute abnormality noted.

## 2020-05-09 IMAGING — DX CHEST - 2 VIEW
2 series · 2 of 2 positions shown · non-contrast
Comparison: Radiographs June 04, 2018

CLINICAL DATA: Shortness of breath.

EXAM:
CHEST - 2 VIEW

[w chest pa]
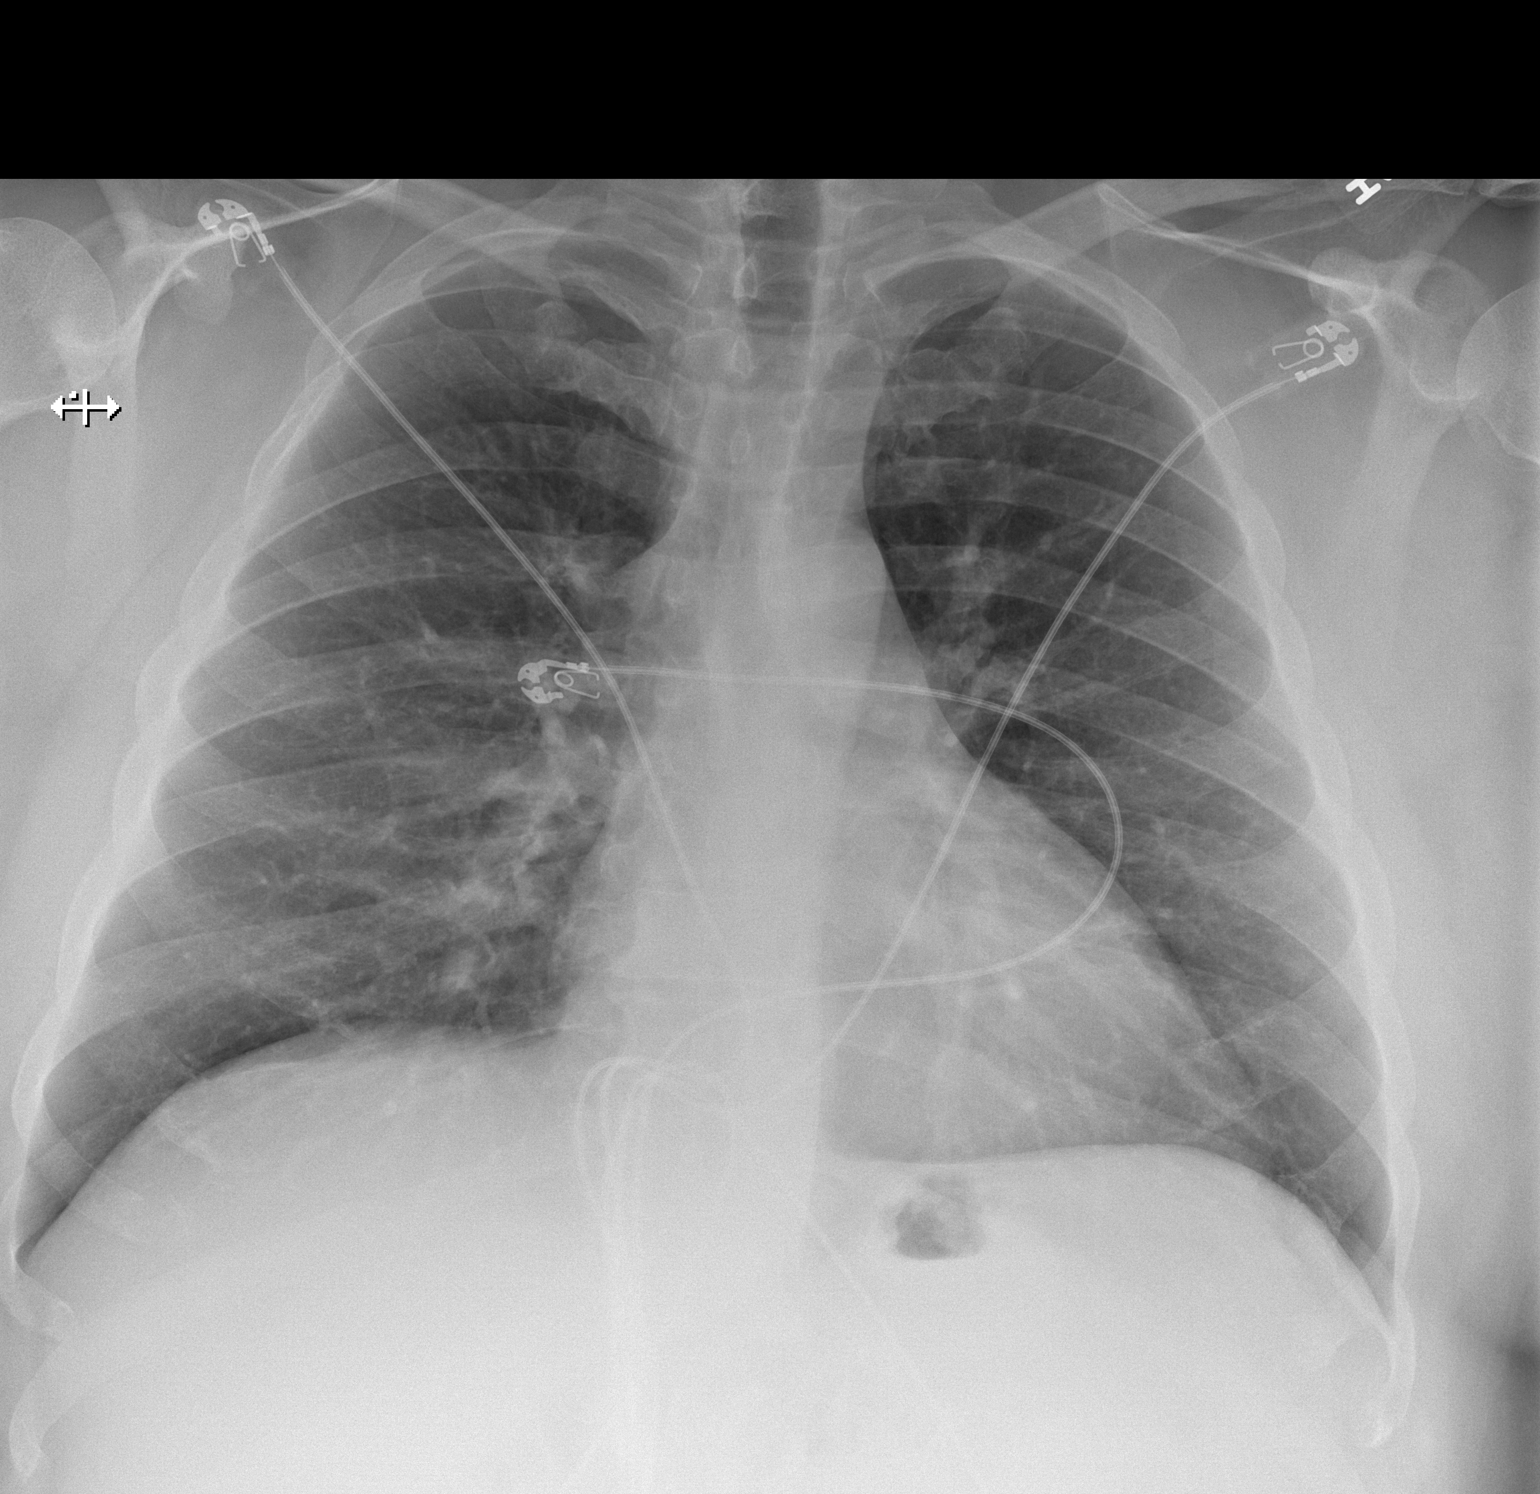

[w chest lat]
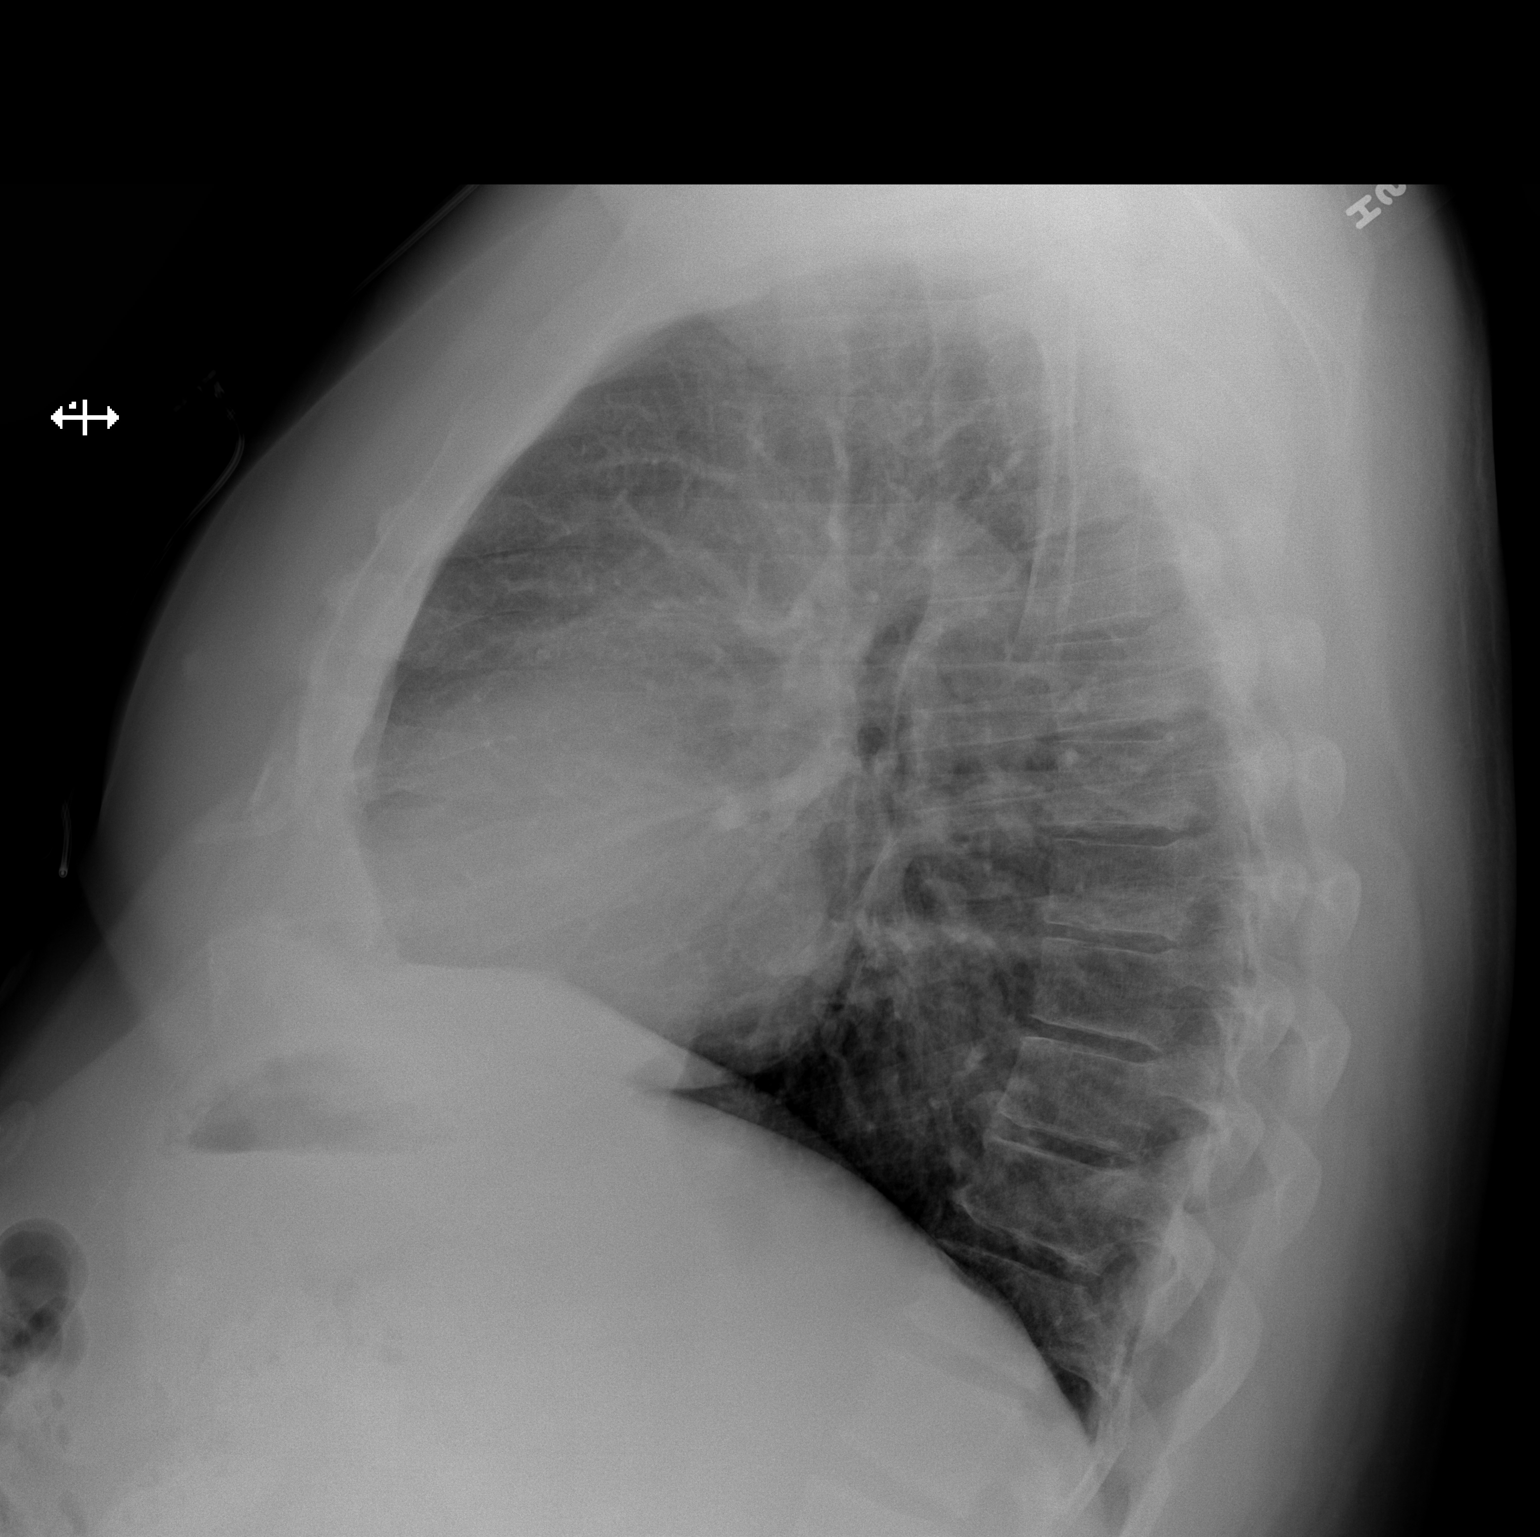

[2 of 2 positions shown; findings below may reference images not displayed]

FINDINGS: The heart size and mediastinal contours are within normal limits.
Both lungs are clear. No pneumothorax or pleural effusion is noted.
The visualized skeletal structures are unremarkable.
IMPRESSION: No active cardiopulmonary disease.
